# Patient Record
Sex: Female | Born: 1985 | Race: Black or African American | Hispanic: No | Marital: Single | Smoking: Never smoker
Health system: Southern US, Community
[De-identification: ages and names within clinical notes are randomized; demographics above are authoritative.]

---
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
## 2006-10-29 ENCOUNTER — Emergency Department (HOSPITAL_COMMUNITY): Admission: EM | Admit: 2006-10-29 | Discharge: 2006-10-29 | Payer: Self-pay | Admitting: Emergency Medicine

## 2008-11-13 IMAGING — CR DG ANKLE COMPLETE 3+V*L*
3 series · 3 of 3 positions shown · non-contrast
Comparison: none

CLINICAL DATA: Fell with lateral foot and ankle pain. 
 LEFT FOOT ? 3 VIEW:

[view not recorded (1 of 3)]
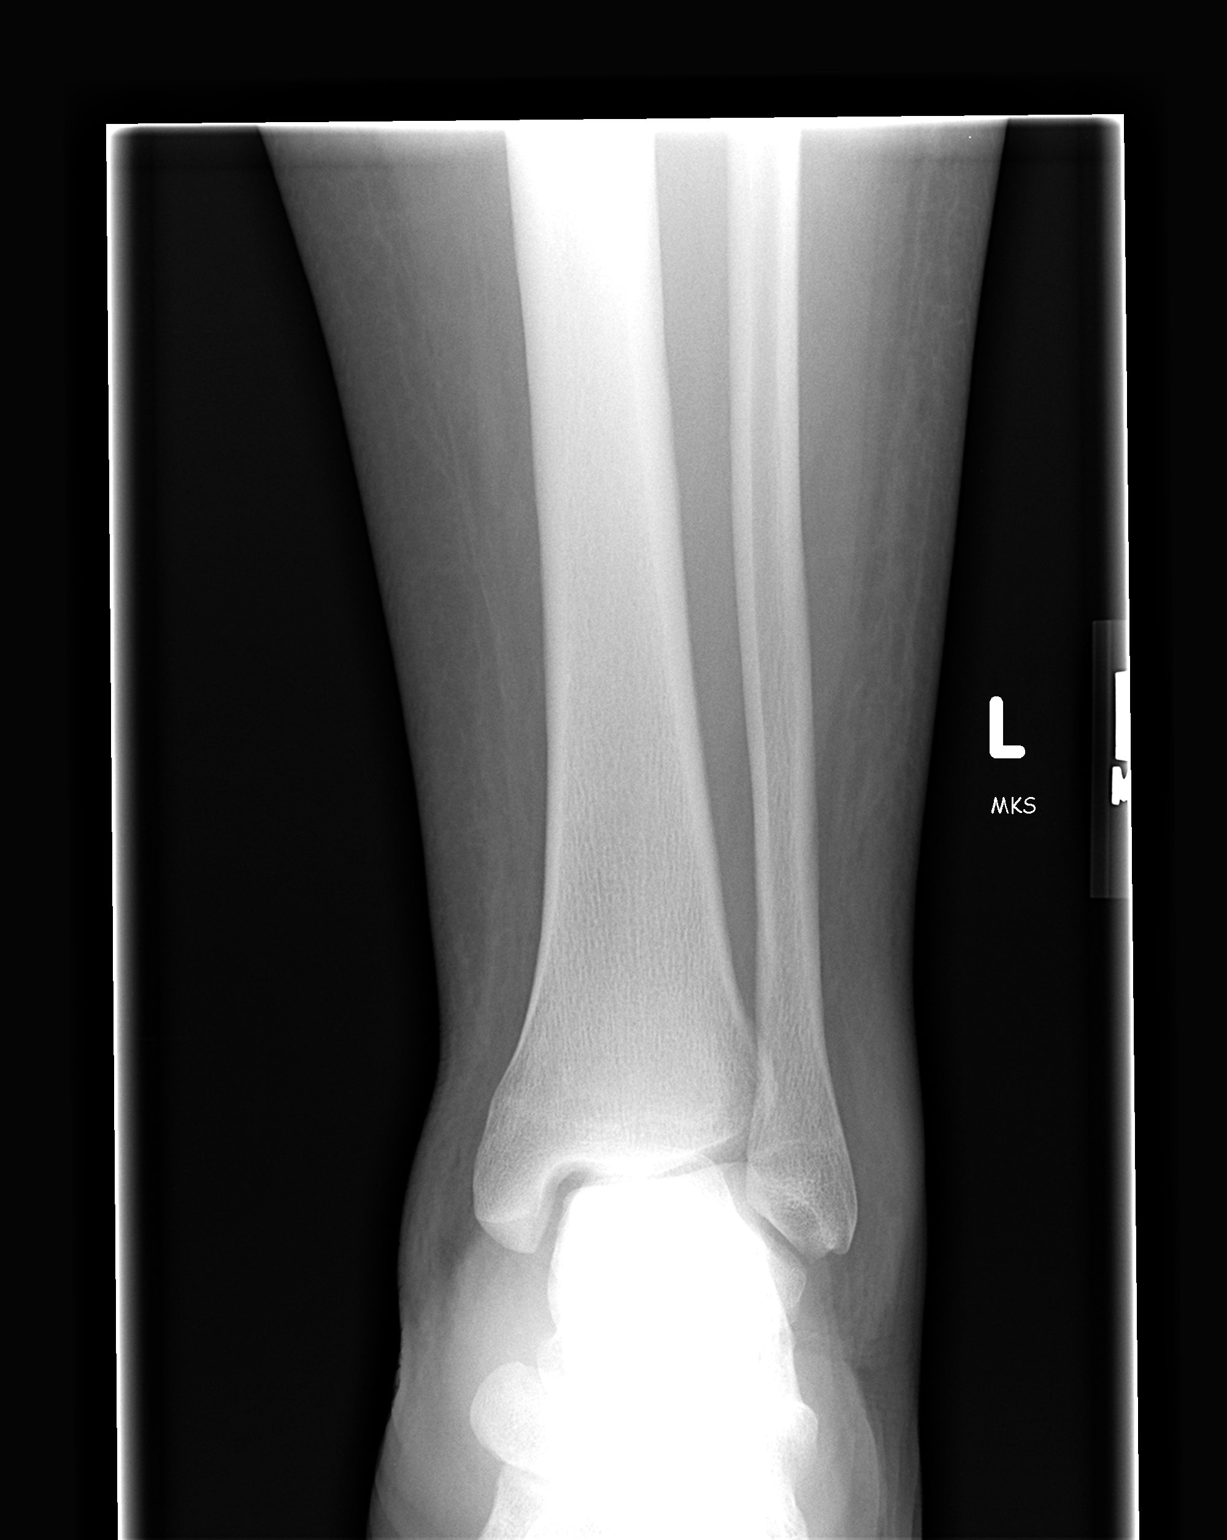

[view not recorded (2 of 3)]
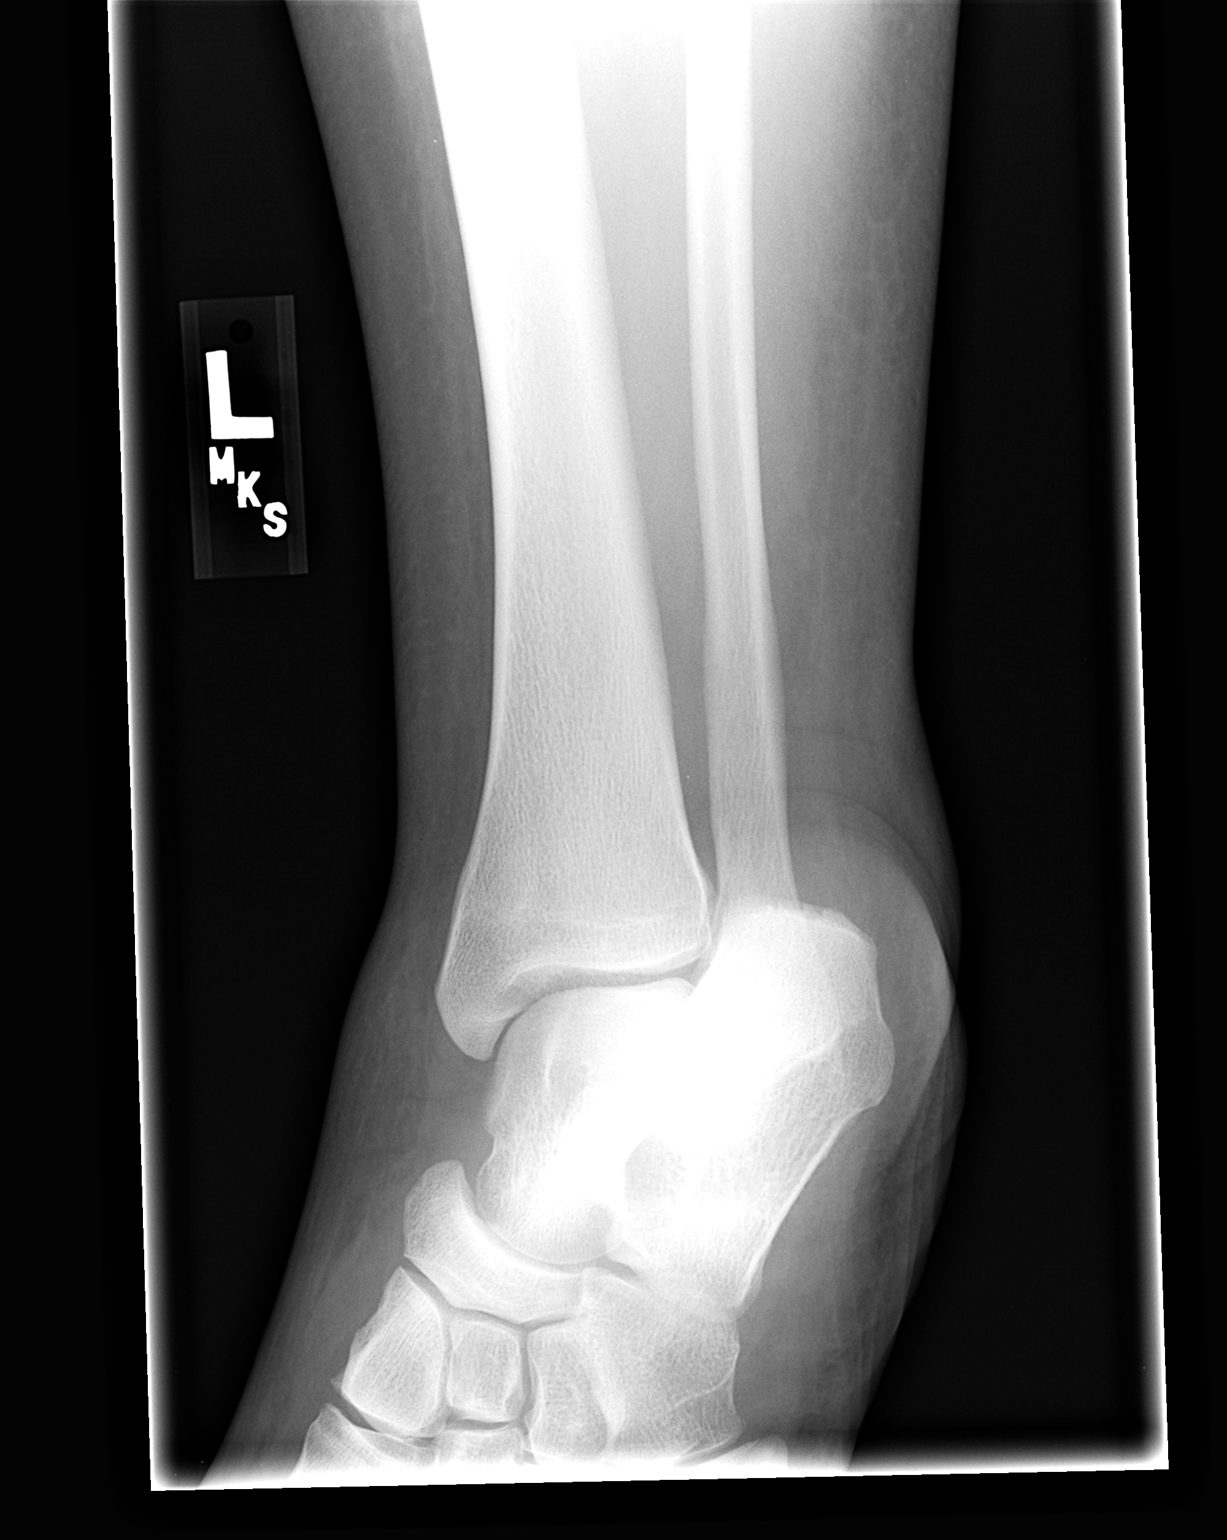

[view not recorded (3 of 3)]
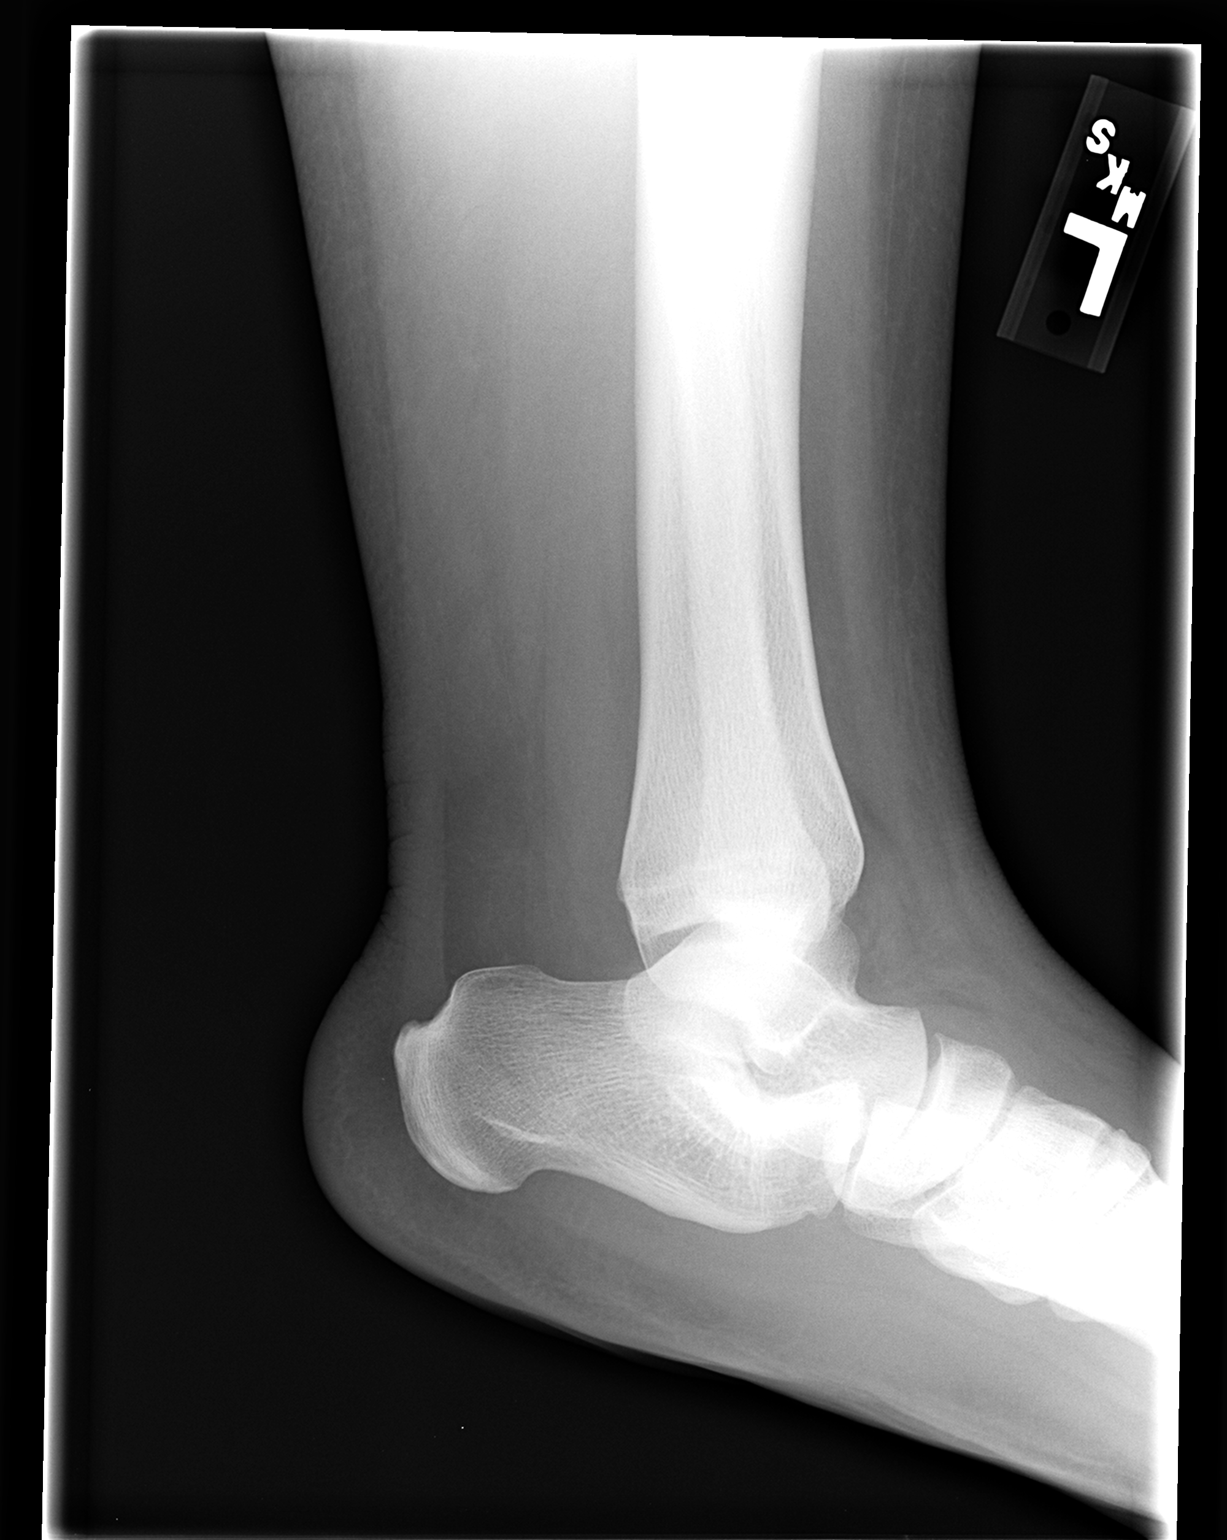

[3 of 3 positions shown; findings below may reference images not displayed]

FINDINGS: No evidence of fracture or dislocation.
IMPRESSION: As discussed above.   
 Left ankle ? 3 VIEW:
FINDINGS: There is soft tissue swelling but no sign of fracture or dislocation.
IMPRESSION: As discussed above.

## 2009-01-19 ENCOUNTER — Emergency Department (HOSPITAL_COMMUNITY): Admission: EM | Admit: 2009-01-19 | Discharge: 2009-01-19 | Payer: Self-pay | Admitting: Emergency Medicine

## 2013-02-03 ENCOUNTER — Encounter: Payer: Self-pay | Admitting: General Practice

## 2013-02-03 ENCOUNTER — Ambulatory Visit (INDEPENDENT_AMBULATORY_CARE_PROVIDER_SITE_OTHER): Payer: Medicaid Other | Admitting: General Practice

## 2013-02-03 ENCOUNTER — Encounter (INDEPENDENT_AMBULATORY_CARE_PROVIDER_SITE_OTHER): Payer: Self-pay

## 2013-02-03 ENCOUNTER — Ambulatory Visit (INDEPENDENT_AMBULATORY_CARE_PROVIDER_SITE_OTHER): Payer: Medicaid Other

## 2013-02-03 VITALS — BP 131/83 | HR 80 | Temp 97.5°F | Ht 62.75 in | Wt 274.0 lb

## 2013-02-03 DIAGNOSIS — Z7689 Persons encountering health services in other specified circumstances: Secondary | ICD-10-CM

## 2013-02-03 DIAGNOSIS — R1011 Right upper quadrant pain: Secondary | ICD-10-CM

## 2013-02-03 NOTE — Progress Notes (Signed)
  Subjective:    Patient ID: Danielle Elliott, female    DOB: May 15, 1985, 27 y.o.   MRN: [id number]  HPI Patient presents today for annual exam and accompanied by her mother. 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 Reports she has been healthy and taking no prescribed medications. Danielle Elliott lives at home with mother and father. Mother reports patient's last menstrual cycle was one week ago. Mother reports patient's only complaint has been right upper abdominal pain for past 3-4 months. Mother denies noticing aggravating factors and reports given tylenol or motrin periodically, with moderate relief.     Review of Systems  Constitutional: Negative for fever and chills.  Respiratory: Negative for chest tightness and shortness of breath.   Cardiovascular: Negative for chest pain and palpitations.  Gastrointestinal: Positive for abdominal pain. Negative for nausea, vomiting, diarrhea, constipation and blood in stool.  Genitourinary: Negative for dysuria, hematuria and difficulty urinating.  Musculoskeletal: Negative for back pain and neck pain.  Neurological: Negative for dizziness, weakness and headaches.       Objective:   Physical Exam  Constitutional: She is oriented to person, place, and time. She appears well-nourished.  Cardiovascular: Normal rate, regular rhythm and normal heart sounds.   Pulmonary/Chest: Effort normal and breath sounds normal. She exhibits no tenderness.  Abdominal: Soft. Bowel sounds are normal. She exhibits no distension. There is tenderness in the right upper quadrant and right lower quadrant. There is no CVA tenderness, no tenderness at McBurney's point and negative Murphy's sign.  Neurological: She is alert and oriented to person, place, and time.  Skin: Skin is warm and dry.  Psychiatric: She has a normal mood and affect.     [facility] reading (PRIMARY) by Coralie Keens, FNP-C, no acute process noted.                                      Assessment  & Plan:  1. Abdominal pain, right upper quadrant  - DG Abd 1 View; Future  2. Encounter to establish care -Patient's mother reports she would like labs drawn on patient at next visit, she wanted patient to become acquainted with provider prior to blood draws -RTO if symptoms worsen and prn -may seek emergency medical treatment -Patient's mother verbalized understanding -Coralie Keens, FNP-C

## 2013-02-17 ENCOUNTER — Other Ambulatory Visit: Payer: Self-pay | Admitting: General Practice

## 2013-02-17 ENCOUNTER — Other Ambulatory Visit (INDEPENDENT_AMBULATORY_CARE_PROVIDER_SITE_OTHER): Payer: Medicaid Other

## 2013-02-17 DIAGNOSIS — Z0000 Encounter for general adult medical examination without abnormal findings: Secondary | ICD-10-CM

## 2013-02-18 LAB — POCT CBC
MCHC: 32.6 g/dL (ref 31.8–35.4)
MPV: 8.8 fL (ref 0–99.8)
POC Granulocyte: 2.3 (ref 2–6.9)
POC LYMPH PERCENT: 57.2 %L — AB (ref 10–50)
Platelet Count, POC: 206 10*3/uL (ref 142–424)
RDW, POC: 13.9 %
WBC: 6.2 10*3/uL (ref 4.6–10.2)

## 2013-02-19 LAB — CMP14+EGFR
ALT: 15 IU/L (ref 0–32)
Calcium: 9.6 mg/dL (ref 8.7–10.2)
Chloride: 100 mmol/L (ref 97–108)
Glucose: 88 mg/dL (ref 65–99)
Potassium: 4.2 mmol/L (ref 3.5–5.2)
Total Bilirubin: 0.5 mg/dL (ref 0.0–1.2)
Total Protein: 6.8 g/dL (ref 6.0–8.5)

## 2013-02-20 NOTE — Progress Notes (Signed)
Patient came in for labs only.

## 2013-02-24 LAB — POCT URINALYSIS DIPSTICK
Bilirubin, UA: NEGATIVE
Glucose, UA: NEGATIVE
Leukocytes, UA: NEGATIVE
Nitrite, UA: NEGATIVE
pH, UA: 6.5

## 2013-02-24 LAB — POCT UA - MICROSCOPIC ONLY
Casts, Ur, LPF, POC: NEGATIVE
Yeast, UA: NEGATIVE

## 2013-02-24 NOTE — Addendum Note (Signed)
Addended by: Orma Render F on: 02/24/2013 09:59 AM   Modules accepted: Orders

## 2014-02-04 ENCOUNTER — Ambulatory Visit: Payer: Medicaid Other | Admitting: Family

## 2014-04-28 ENCOUNTER — Ambulatory Visit: Payer: Medicaid Other | Admitting: Nurse Practitioner

## 2014-06-29 ENCOUNTER — Encounter: Payer: Self-pay | Admitting: Nurse Practitioner

## 2014-06-29 ENCOUNTER — Ambulatory Visit (INDEPENDENT_AMBULATORY_CARE_PROVIDER_SITE_OTHER): Payer: Medicaid Other | Admitting: Nurse Practitioner

## 2014-06-29 VITALS — BP 144/82 | HR 81 | Temp 97.3°F | Ht 62.0 in | Wt 280.0 lb

## 2014-06-29 DIAGNOSIS — Z0000 Encounter for general adult medical examination without abnormal findings: Secondary | ICD-10-CM | POA: Diagnosis not present

## 2014-06-29 DIAGNOSIS — F79 Unspecified intellectual disabilities: Secondary | ICD-10-CM | POA: Insufficient documentation

## 2014-06-29 DIAGNOSIS — I10 Essential (primary) hypertension: Secondary | ICD-10-CM

## 2014-06-29 MED ORDER — LISINOPRIL 20 MG PO TABS: 20.0000 mg | ORAL_TABLET | Freq: Every day | ORAL | Status: AC

## 2014-06-29 NOTE — Progress Notes (Signed)
   Subjective:    Patient ID: Danielle Elliott, female    DOB: 1985-08-19, 29 y.o.   MRN: [id number]  HPI Patient is here with her mother for an annual physical. Mother denies any chronic disease. No acute complaints. Mental handicap so questions are answered by mom.   Review of Systems  Constitutional: Negative.   HENT: Negative.   Eyes: Negative.   Respiratory: Negative.   Cardiovascular: Negative.   Gastrointestinal: Negative.   Endocrine: Negative.   Genitourinary: Negative.   Musculoskeletal: Negative.   Skin: Negative.   Allergic/Immunologic: Negative.   Neurological: Negative.   Hematological: Negative.   Psychiatric/Behavioral: Negative.        Objective:   Physical Exam  Constitutional: She appears well-developed and well-nourished.  HENT:  Head: Normocephalic.  Multiple dental caries  Eyes: Pupils are equal, round, and reactive to light.  Neck: Normal range of motion.  Cardiovascular: Normal rate and regular rhythm.   Pulmonary/Chest: Effort normal and breath sounds normal.  Musculoskeletal: Normal range of motion.  Neurological: She is alert.  Skin: Skin is warm.  Psychiatric: She has a normal mood and affect.           Assessment & Plan:  1. Annual physical exam - NMR, lipoprofile - Thyroid Panel With TSH - CBC  2. Mental handicap  3. Severe obesity (BMI >= 40) Discussed diet and exercise for person with BMI >25 Will recheck weight in 3-6 months   4. Essential hypertension Do not add salt to diet - CMP14+EGFR - lisinopril (PRINIVIL,ZESTRIL) 20 MG tablet; Take 1 tablet (20 mg total) by mouth daily.  Dispense: 90 tablet; Refill: 3    Labs pending Health maintenance reviewed Diet and exercise encouraged Continue all meds Follow up  In 6 months   [place], FNP

## 2014-06-29 NOTE — Patient Instructions (Signed)
Exercise to Lose Weight Exercise and a healthy diet may help you lose weight. Your doctor may suggest specific exercises. EXERCISE IDEAS AND TIPS  Choose low-cost things you enjoy doing, such as walking, bicycling, or exercising to workout videos.  Take stairs instead of the elevator.  Walk during your lunch break.  Park your car further away from work or school.  Go to a gym or an exercise class.  Start with 5 to 10 minutes of exercise each day. Build up to 30 minutes of exercise 4 to 6 days a week.  Wear shoes with good support and comfortable clothes.  Stretch before and after working out.  Work out until you breathe harder and your heart beats faster.  Drink extra water when you exercise.  Do not do so much that you hurt yourself, feel dizzy, or get very short of breath. Exercises that burn about 150 calories:  Running 1  miles in 15 minutes.  Playing volleyball for 45 to 60 minutes.  Washing and waxing a car for 45 to 60 minutes.  Playing touch football for 45 minutes.  Walking 1  miles in 35 minutes.  Pushing a stroller 1  miles in 30 minutes.  Playing basketball for 30 minutes.  Raking leaves for 30 minutes.  Bicycling 5 miles in 30 minutes.  Walking 2 miles in 30 minutes.  Dancing for 30 minutes.  Shoveling snow for 15 minutes.  Swimming laps for 20 minutes.  Walking up stairs for 15 minutes.  Bicycling 4 miles in 15 minutes.  Gardening for 30 to 45 minutes.  Jumping rope for 15 minutes.  Washing windows or floors for 45 to 60 minutes. Document Released: 05/13/2010 Document Revised: 07/03/2011 Document Reviewed: 05/13/2010 [employer] Patient Information 2015 [employer]. This information is not intended to replace advice given to you by your health care provider. Make sure you discuss any questions you have with your health care provider.  

## 2014-06-30 LAB — NMR, LIPOPROFILE
CHOLESTEROL: 150 mg/dL (ref 100–199)
HDL CHOLESTEROL BY NMR: 42 mg/dL (ref >39)
HDL PARTICLE NUMBER: 29.5 umol/L — AB (ref >=30.5)
LDL Particle Number: 961 nmol/L (ref <1000)
LDL Size: 20.7 nm (ref >20.5)
LDL-C: 79 mg/dL (ref 0–99)
LP-IR SCORE: 65 — AB (ref <=45)
Small LDL Particle Number: 321 nmol/L (ref <=527)
TRIGLYCERIDES BY NMR: 144 mg/dL (ref 0–149)

## 2014-06-30 LAB — CBC
HEMATOCRIT: 36.2 % (ref 34.0–46.6)
Hemoglobin: 12.2 g/dL (ref 11.1–15.9)
MCH: 28.1 pg (ref 26.6–33.0)
MCHC: 33.7 g/dL (ref 31.5–35.7)
MCV: 83 fL (ref 79–97)
Platelets: 243 10*3/uL (ref 150–379)
RBC: 4.34 x10E6/uL (ref 3.77–5.28)
RDW: 14.8 % (ref 12.3–15.4)
WBC: 5.4 10*3/uL (ref 3.4–10.8)

## 2014-06-30 LAB — CMP14+EGFR
A/G RATIO: 1.5 (ref 1.1–2.5)
ALBUMIN: 4.1 g/dL (ref 3.5–5.5)
ALK PHOS: 58 IU/L (ref 39–117)
ALT: 14 IU/L (ref 0–32)
AST: 18 IU/L (ref 0–40)
BUN / CREAT RATIO: 8 (ref 8–20)
BUN: 7 mg/dL (ref 6–20)
Bilirubin Total: 0.6 mg/dL (ref 0.0–1.2)
CO2: 24 mmol/L (ref 18–29)
CREATININE: 0.87 mg/dL (ref 0.57–1.00)
Calcium: 9.6 mg/dL (ref 8.7–10.2)
Chloride: 105 mmol/L (ref 97–108)
GFR calc Af Amer: 103 mL/min/{1.73_m2} (ref >59)
GFR, EST NON AFRICAN AMERICAN: 90 mL/min/{1.73_m2} (ref >59)
GLOBULIN, TOTAL: 2.8 g/dL (ref 1.5–4.5)
Glucose: 81 mg/dL (ref 65–99)
Potassium: 4.5 mmol/L (ref 3.5–5.2)
SODIUM: 143 mmol/L (ref 134–144)
Total Protein: 6.9 g/dL (ref 6.0–8.5)

## 2014-06-30 LAB — THYROID PANEL WITH TSH
FREE THYROXINE INDEX: 2.3 (ref 1.2–4.9)
T3 Uptake Ratio: 30 % (ref 24–39)
T4, Total: 7.8 ug/dL (ref 4.5–12.0)
TSH: 1.21 u[IU]/mL (ref 0.450–4.500)

## 2014-07-06 ENCOUNTER — Encounter: Payer: Self-pay | Admitting: Nurse Practitioner

## 2015-02-19 IMAGING — CR DG ABDOMEN 1V
1 series · 1 of 1 positions shown · non-contrast
Comparison: None.

CLINICAL DATA: Right upper quadrant abdominal pain.

EXAM:
ABDOMEN - 1 VIEW

[view not recorded]
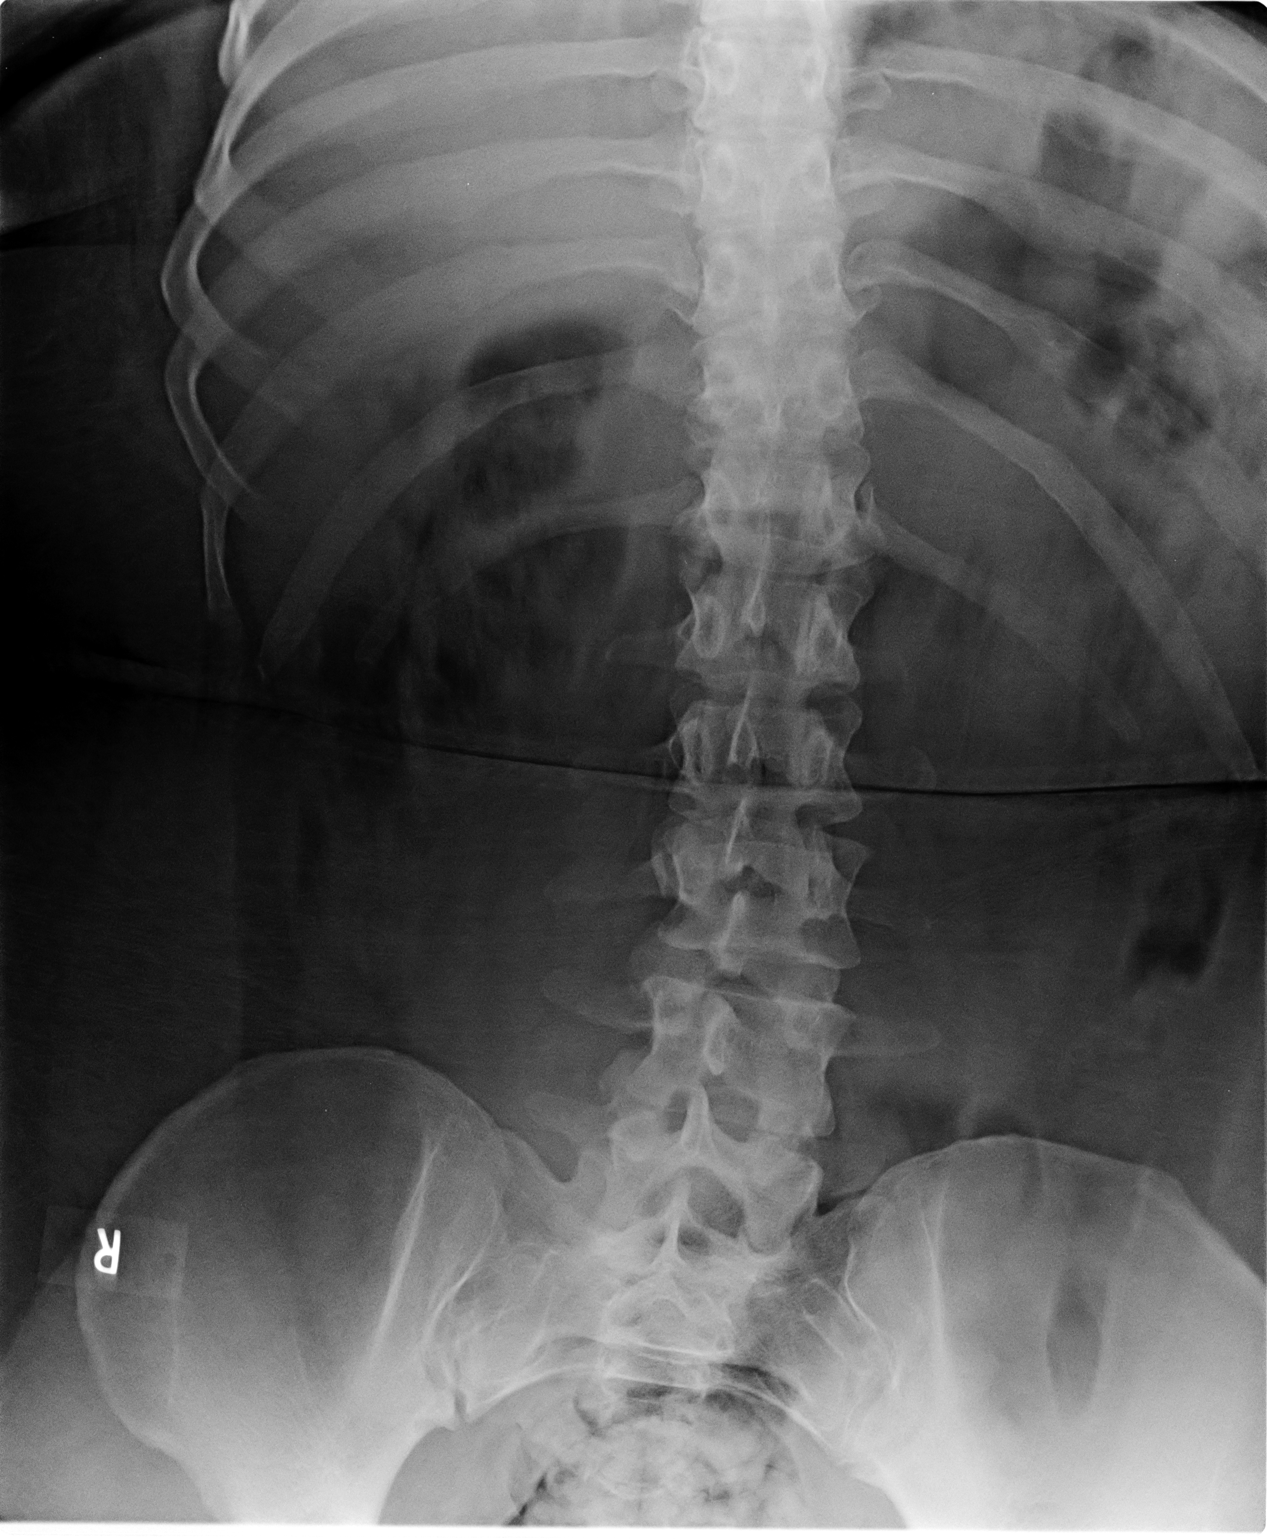

[1 of 1 positions shown; findings below may reference images not displayed]

FINDINGS: Bowel gas pattern is normal. No abnormal abdominal calcifications.
No acute osseous abnormality.
IMPRESSION: Benign appearing abdomen.

## 2015-07-02 ENCOUNTER — Other Ambulatory Visit: Payer: Self-pay | Admitting: Nurse Practitioner

## 2019-08-12 DIAGNOSIS — I10 Essential (primary) hypertension: Secondary | ICD-10-CM | POA: Diagnosis not present

## 2019-08-12 DIAGNOSIS — Z299 Encounter for prophylactic measures, unspecified: Secondary | ICD-10-CM | POA: Diagnosis not present

## 2019-08-12 DIAGNOSIS — M549 Dorsalgia, unspecified: Secondary | ICD-10-CM | POA: Diagnosis not present

## 2020-01-21 DIAGNOSIS — Z299 Encounter for prophylactic measures, unspecified: Secondary | ICD-10-CM | POA: Diagnosis not present

## 2020-01-21 DIAGNOSIS — Z0000 Encounter for general adult medical examination without abnormal findings: Secondary | ICD-10-CM | POA: Diagnosis not present

## 2020-01-21 DIAGNOSIS — I10 Essential (primary) hypertension: Secondary | ICD-10-CM | POA: Diagnosis not present

## 2020-01-21 DIAGNOSIS — Z7189 Other specified counseling: Secondary | ICD-10-CM | POA: Diagnosis not present

## 2021-01-03 DIAGNOSIS — J020 Streptococcal pharyngitis: Secondary | ICD-10-CM | POA: Diagnosis not present

## 2021-01-03 DIAGNOSIS — J029 Acute pharyngitis, unspecified: Secondary | ICD-10-CM | POA: Diagnosis not present

## 2021-01-03 DIAGNOSIS — Z299 Encounter for prophylactic measures, unspecified: Secondary | ICD-10-CM | POA: Diagnosis not present

## 2021-03-16 DIAGNOSIS — Z7189 Other specified counseling: Secondary | ICD-10-CM | POA: Diagnosis not present

## 2021-03-16 DIAGNOSIS — R5383 Other fatigue: Secondary | ICD-10-CM | POA: Diagnosis not present

## 2021-03-16 DIAGNOSIS — I10 Essential (primary) hypertension: Secondary | ICD-10-CM | POA: Diagnosis not present

## 2021-03-16 DIAGNOSIS — Z299 Encounter for prophylactic measures, unspecified: Secondary | ICD-10-CM | POA: Diagnosis not present

## 2021-03-16 DIAGNOSIS — Z0000 Encounter for general adult medical examination without abnormal findings: Secondary | ICD-10-CM | POA: Diagnosis not present

## 2021-03-16 DIAGNOSIS — Z720 Tobacco use: Secondary | ICD-10-CM | POA: Diagnosis not present

## 2021-03-16 DIAGNOSIS — E7800 Pure hypercholesterolemia, unspecified: Secondary | ICD-10-CM | POA: Diagnosis not present

## 2021-04-22 DIAGNOSIS — R079 Chest pain, unspecified: Secondary | ICD-10-CM | POA: Diagnosis not present

## 2021-04-22 DIAGNOSIS — Z041 Encounter for examination and observation following transport accident: Secondary | ICD-10-CM | POA: Diagnosis not present

## 2021-04-22 DIAGNOSIS — R0789 Other chest pain: Secondary | ICD-10-CM | POA: Diagnosis not present

## 2021-04-22 DIAGNOSIS — S3993XA Unspecified injury of pelvis, initial encounter: Secondary | ICD-10-CM | POA: Diagnosis not present

## 2021-04-22 DIAGNOSIS — S0990XA Unspecified injury of head, initial encounter: Secondary | ICD-10-CM | POA: Diagnosis not present

## 2021-04-22 DIAGNOSIS — R0602 Shortness of breath: Secondary | ICD-10-CM | POA: Diagnosis not present

## 2021-04-22 DIAGNOSIS — S3991XA Unspecified injury of abdomen, initial encounter: Secondary | ICD-10-CM | POA: Diagnosis not present

## 2021-04-22 DIAGNOSIS — R6250 Unspecified lack of expected normal physiological development in childhood: Secondary | ICD-10-CM | POA: Diagnosis not present

## 2021-04-22 DIAGNOSIS — S299XXA Unspecified injury of thorax, initial encounter: Secondary | ICD-10-CM | POA: Diagnosis not present

## 2021-09-13 DIAGNOSIS — I10 Essential (primary) hypertension: Secondary | ICD-10-CM | POA: Diagnosis not present

## 2021-09-13 DIAGNOSIS — Z299 Encounter for prophylactic measures, unspecified: Secondary | ICD-10-CM | POA: Diagnosis not present

## 2021-09-13 DIAGNOSIS — Z713 Dietary counseling and surveillance: Secondary | ICD-10-CM | POA: Diagnosis not present

## 2021-10-21 DIAGNOSIS — Z789 Other specified health status: Secondary | ICD-10-CM | POA: Diagnosis not present

## 2021-10-21 DIAGNOSIS — L03012 Cellulitis of left finger: Secondary | ICD-10-CM | POA: Diagnosis not present

## 2021-10-21 DIAGNOSIS — I10 Essential (primary) hypertension: Secondary | ICD-10-CM | POA: Diagnosis not present

## 2021-10-21 DIAGNOSIS — Z299 Encounter for prophylactic measures, unspecified: Secondary | ICD-10-CM | POA: Diagnosis not present

## 2021-10-28 DIAGNOSIS — L03012 Cellulitis of left finger: Secondary | ICD-10-CM | POA: Diagnosis not present

## 2021-10-28 DIAGNOSIS — Z299 Encounter for prophylactic measures, unspecified: Secondary | ICD-10-CM | POA: Diagnosis not present

## 2021-10-28 DIAGNOSIS — I10 Essential (primary) hypertension: Secondary | ICD-10-CM | POA: Diagnosis not present

## 2021-10-28 DIAGNOSIS — Z713 Dietary counseling and surveillance: Secondary | ICD-10-CM | POA: Diagnosis not present

## 2022-03-21 DIAGNOSIS — I10 Essential (primary) hypertension: Secondary | ICD-10-CM | POA: Diagnosis not present

## 2022-03-21 DIAGNOSIS — R5383 Other fatigue: Secondary | ICD-10-CM | POA: Diagnosis not present

## 2022-03-21 DIAGNOSIS — E7800 Pure hypercholesterolemia, unspecified: Secondary | ICD-10-CM | POA: Diagnosis not present

## 2022-03-21 DIAGNOSIS — Z7189 Other specified counseling: Secondary | ICD-10-CM | POA: Diagnosis not present

## 2022-03-21 DIAGNOSIS — Z299 Encounter for prophylactic measures, unspecified: Secondary | ICD-10-CM | POA: Diagnosis not present

## 2022-03-21 DIAGNOSIS — Z0000 Encounter for general adult medical examination without abnormal findings: Secondary | ICD-10-CM | POA: Diagnosis not present

## 2022-03-23 DIAGNOSIS — E7800 Pure hypercholesterolemia, unspecified: Secondary | ICD-10-CM | POA: Diagnosis not present

## 2022-03-23 DIAGNOSIS — Z0000 Encounter for general adult medical examination without abnormal findings: Secondary | ICD-10-CM | POA: Diagnosis not present

## 2022-03-23 DIAGNOSIS — R5383 Other fatigue: Secondary | ICD-10-CM | POA: Diagnosis not present

## 2023-09-10 DIAGNOSIS — Z79899 Other long term (current) drug therapy: Secondary | ICD-10-CM | POA: Diagnosis not present

## 2023-09-10 DIAGNOSIS — R5383 Other fatigue: Secondary | ICD-10-CM | POA: Diagnosis not present

## 2023-09-10 DIAGNOSIS — I10 Essential (primary) hypertension: Secondary | ICD-10-CM | POA: Diagnosis not present

## 2023-09-10 DIAGNOSIS — Z0000 Encounter for general adult medical examination without abnormal findings: Secondary | ICD-10-CM | POA: Diagnosis not present

## 2023-09-10 DIAGNOSIS — Z7189 Other specified counseling: Secondary | ICD-10-CM | POA: Diagnosis not present

## 2023-09-10 DIAGNOSIS — Z299 Encounter for prophylactic measures, unspecified: Secondary | ICD-10-CM | POA: Diagnosis not present

## 2023-09-10 DIAGNOSIS — Z713 Dietary counseling and surveillance: Secondary | ICD-10-CM | POA: Diagnosis not present

## 2023-09-10 DIAGNOSIS — E7800 Pure hypercholesterolemia, unspecified: Secondary | ICD-10-CM | POA: Diagnosis not present
